# Patient Record
Sex: Male | Born: 1970 | Race: White | Hispanic: Yes | Marital: Single | Smoking: Never smoker
Health system: Southern US, Community
[De-identification: ages and names within clinical notes are randomized; demographics above are authoritative.]

---

## 2006-01-15 ENCOUNTER — Emergency Department (HOSPITAL_COMMUNITY): Admission: EM | Admit: 2006-01-15 | Discharge: 2006-01-16 | Payer: Self-pay | Admitting: Emergency Medicine

## 2008-04-02 IMAGING — CR DG HAND COMPLETE 3+V*R*
3 series · 3 of 3 positions shown · non-contrast
Comparison: none

CLINICAL DATA: Injury and pain. 
 RIGHT HAND ? 3 VIEW:

[x hand pa right]
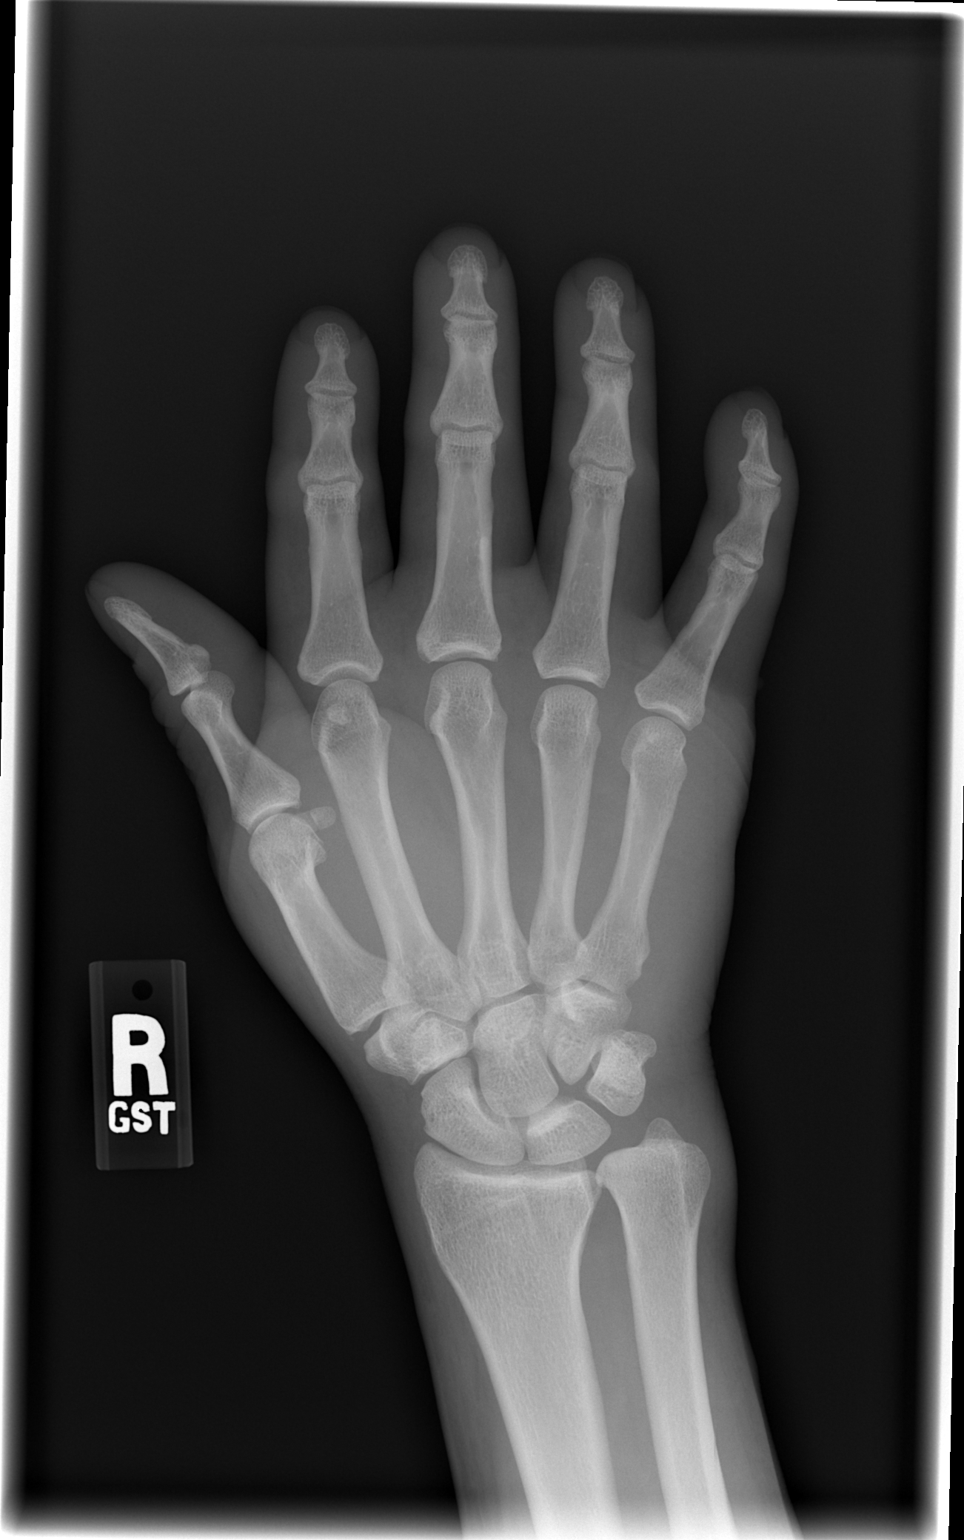

[x hand oblique right]
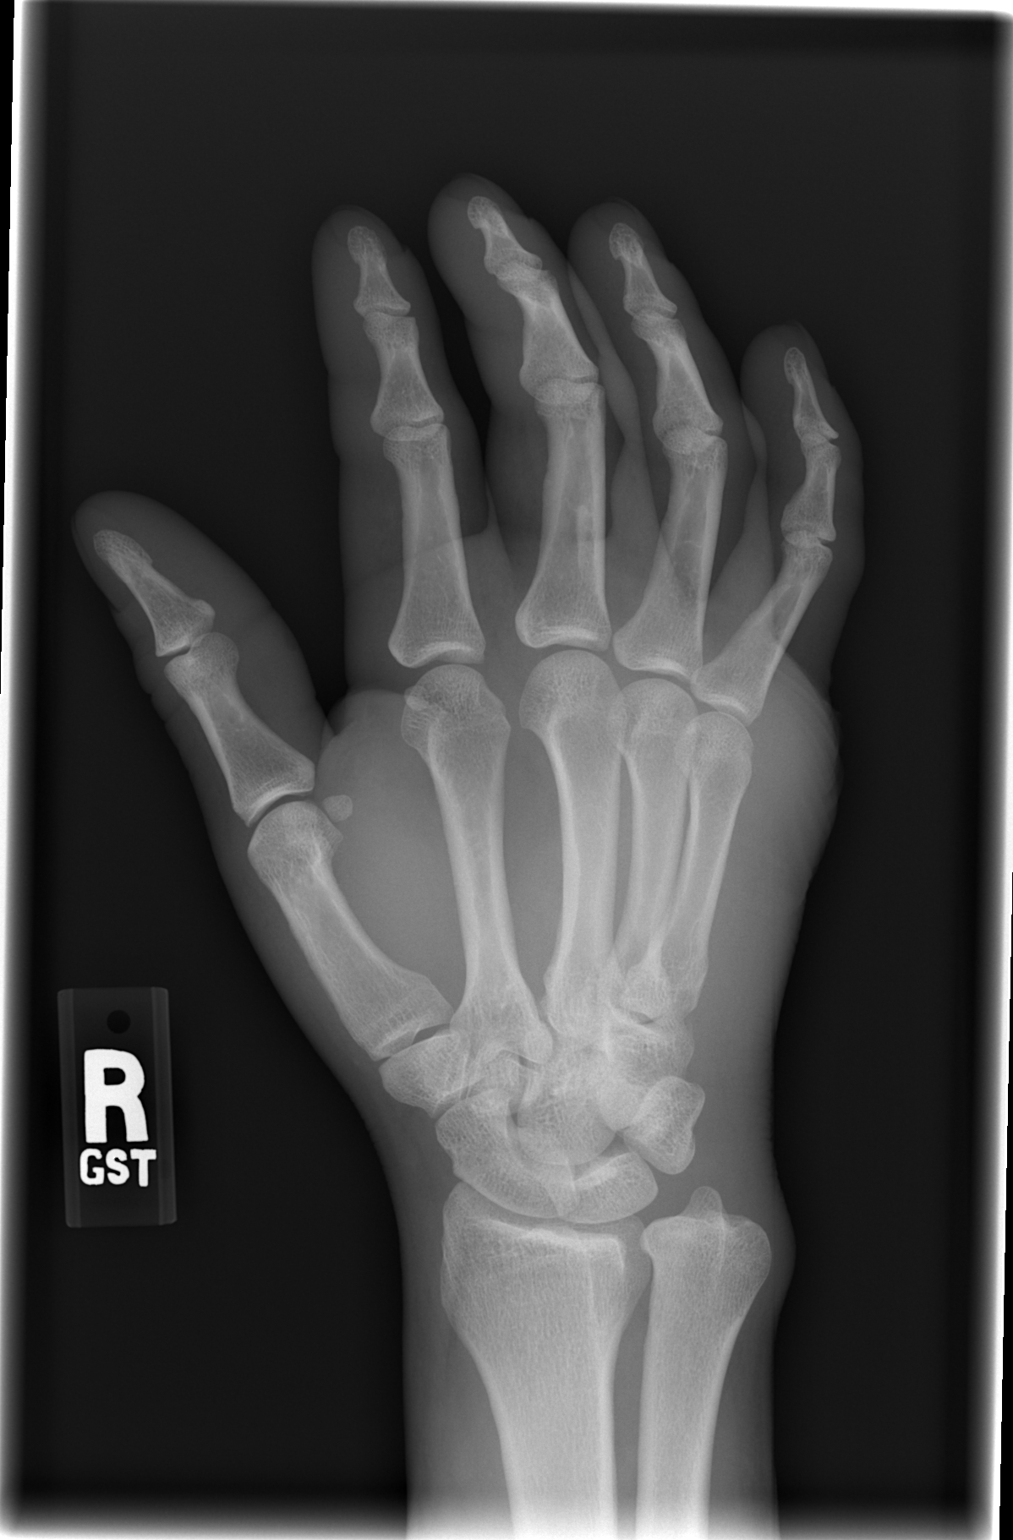

[x hand lat right]
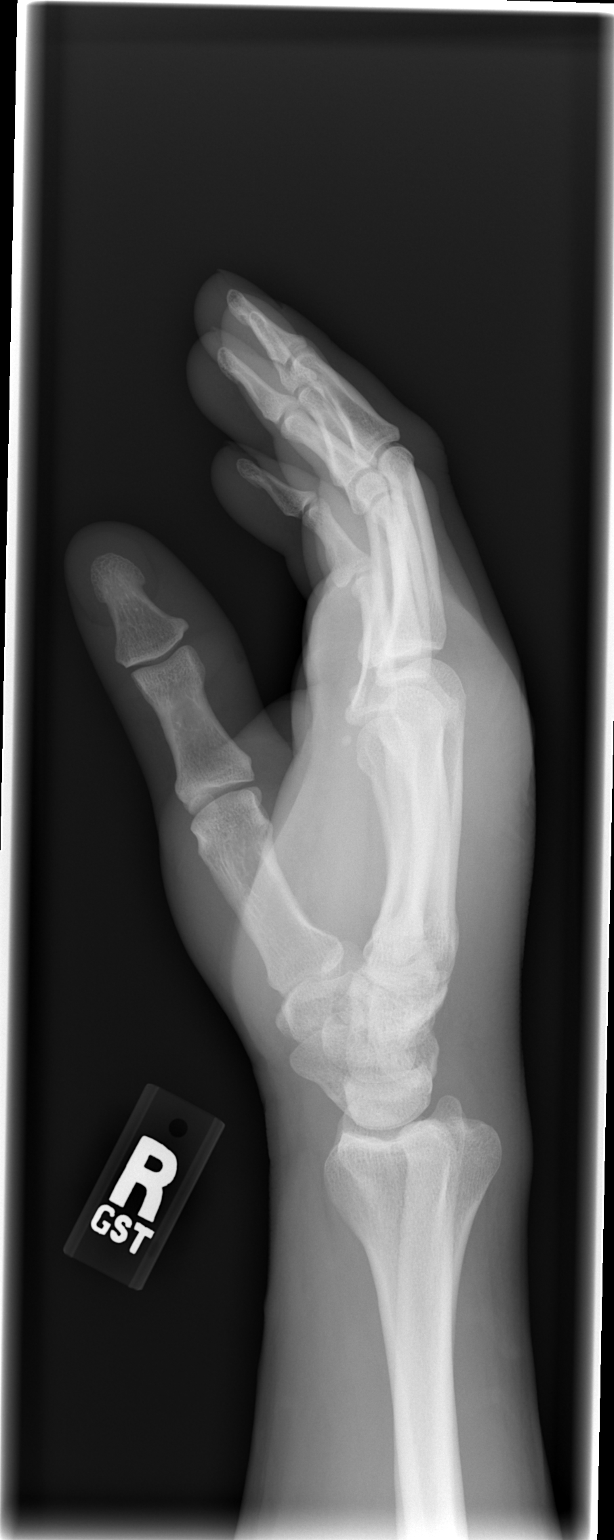

[3 of 3 positions shown; findings below may reference images not displayed]

FINDINGS: There is soft tissue swelling about the ulnar side of the hand.  No underlying fracture or dislocation.
IMPRESSION: Swelling without bony or joint abnormality.

## 2015-03-02 ENCOUNTER — Ambulatory Visit: Payer: Self-pay | Admitting: Podiatry

## 2015-03-16 ENCOUNTER — Ambulatory Visit (INDEPENDENT_AMBULATORY_CARE_PROVIDER_SITE_OTHER): Payer: Self-pay

## 2015-03-16 ENCOUNTER — Encounter: Payer: Self-pay | Admitting: Podiatry

## 2015-03-16 ENCOUNTER — Ambulatory Visit (INDEPENDENT_AMBULATORY_CARE_PROVIDER_SITE_OTHER): Payer: Self-pay | Admitting: Podiatry

## 2015-03-16 VITALS — BP 150/100 | HR 97 | Resp 18

## 2015-03-16 DIAGNOSIS — S92901K Unspecified fracture of right foot, subsequent encounter for fracture with nonunion: Secondary | ICD-10-CM

## 2015-03-16 DIAGNOSIS — R52 Pain, unspecified: Secondary | ICD-10-CM

## 2015-03-16 DIAGNOSIS — M779 Enthesopathy, unspecified: Secondary | ICD-10-CM

## 2015-03-16 NOTE — Progress Notes (Signed)
   Subjective:    Patient ID: Daryl Calderon, male    DOB: [birth year], 44 y.o.   MRN: [id number]  HPI  44 year old male presents the office they with concerns or pain the right foot. He states that he has previous is seen for this by Dr. Fredderick PhenixAnderton. He states he has had steroid injection this area for which seem to help. He also presents he had surgery. He denies any recent injury or trauma we denies ever any fractures this foot. He denies any swelling or redness. He states the pain is intermittent however on daily basis after he stands worse for long periods of time. He has no other complaints at this time.  Review of Systems  All other systems reviewed and are negative.      Objective:   Physical Exam AAO 3, NAD DP/PT pulses palpable 2/4, CRT less than 3 seconds Protective sensation intact with Simplex monofilament, Achilles tendon reflex intact. There is tenderness near the fifth metatarsal base of the right foot more on the dorsal aspect. There is no overlying edema, erythema, increase in warmth. There is no palmar course less insertion the peroneal tendon. There is no areas of tenderness bilateral lower extremities. Open lesions or pre-ulcerative lesions. No pain with calf compression, swelling, warmth, erythema.       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 There does be evidence of old fracture of the fifth metatarsal base and this is where he does have symptoms. I discussed steroid injection of the area and he wishes to proceed. Injected a total of 2 mL of a mixture of 2% lidocaine plain, 0.5% Marcaine plain and Kenalog 10. I discussed orthotics in shoe gear changes. Discussed bracing.  -Follow-up  In 6 weeks or sooner if any problems arise. In the meantime, encouraged to call the office  with any questions, concerns, change in symptoms.   Ovid CurdMatthew Wagoner, DPM

## 2015-03-18 ENCOUNTER — Encounter: Payer: Self-pay | Admitting: Podiatry

## 2015-04-27 ENCOUNTER — Ambulatory Visit (INDEPENDENT_AMBULATORY_CARE_PROVIDER_SITE_OTHER): Payer: Self-pay | Admitting: Podiatry

## 2015-04-27 ENCOUNTER — Encounter: Payer: Self-pay | Admitting: Podiatry

## 2015-04-27 VITALS — BP 156/109 | HR 96 | Resp 18

## 2015-04-27 DIAGNOSIS — M779 Enthesopathy, unspecified: Secondary | ICD-10-CM

## 2015-04-27 DIAGNOSIS — S92901K Unspecified fracture of right foot, subsequent encounter for fracture with nonunion: Secondary | ICD-10-CM

## 2015-04-27 MED ORDER — MELOXICAM 15 MG PO TABS: 15.0000 mg | ORAL_TABLET | Freq: Every day | ORAL | Status: AC

## 2015-04-30 ENCOUNTER — Encounter: Payer: Self-pay | Admitting: Podiatry

## 2015-04-30 NOTE — Progress Notes (Signed)
Patient ID: Emmanuell Kantz, male   DOB: 10/01/1970, 45 y.o.   MRN: [id number]  Subjective: 45 year old male presents the office they for follow-up evaluation of pain to the right foot. He states that he has good days and bad days. He does continue to wear a regular shoe and he works [employer]. He has not been taking any anti-inflammatory medications or other medications for this. The injection does help. Denies any systemic complaints such as fevers, chills, nausea, vomiting. No acute changes since last appointment, and no other complaints at this time.   Objective: AAO x3, NAD DP/PT pulses palpable bilaterally, CRT less than 3 seconds Protective sensation intact with Simms Weinstein monofilament There is continued tenderness to palpation on the right foot on the fifth metatarsal base. This is over the area of the nonunion based on x-ray. There is no pain with vibratory sensation. There is no pain on the course of the peroneal tendons. There is no other areas of tenderness to bilateral lower extremities. MMT 5/5, ROM WNL. No edema, erythema, increase in warmth to bilateral lower extremities.  No open lesions or pre-ulcerative lesions.  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  Plan: -All treatment options discussed with the patient including all alternatives, risks, complications.  -I discussed etiologies of the symptoms today, the patient. I discussed both conservative and surgical treatment options. He wishes to hold off on surgery or invasive options as he does not have insurance. -Prescribed mobic. Discussed side effects of the medication and directed to stop if any are to occur and call the office.  -Recommend OTC inserts. Discussed him what to look for and purchasing these. Offloading pads to the area. Continue supportive shoe gear.  -Patient encouraged to call the office with any questions, concerns,  change in symptoms.   Ovid Curd, DPM
